# Patient Record
Sex: Male | Born: 1995 | Race: Black or African American | Hispanic: No | Marital: Single | Smoking: Current every day smoker
Health system: Southern US, Community
[De-identification: ages and names within clinical notes are randomized; demographics above are authoritative.]

## PROBLEM LIST (undated history)

## (undated) DIAGNOSIS — J45909 Unspecified asthma, uncomplicated: Secondary | ICD-10-CM

## (undated) DIAGNOSIS — M419 Scoliosis, unspecified: Secondary | ICD-10-CM

---

## 2018-03-27 ENCOUNTER — Emergency Department (HOSPITAL_COMMUNITY)
Admission: EM | Admit: 2018-03-27 | Discharge: 2018-03-27 | Disposition: A | Discharge status: Home / Self Care | Payer: Self-pay | Attending: Emergency Medicine | Admitting: Emergency Medicine

## 2018-03-27 ENCOUNTER — Emergency Department (HOSPITAL_COMMUNITY): Payer: Self-pay

## 2018-03-27 ENCOUNTER — Encounter (HOSPITAL_COMMUNITY): Payer: Self-pay | Admitting: *Deleted

## 2018-03-27 DIAGNOSIS — M62838 Other muscle spasm: Secondary | ICD-10-CM | POA: Insufficient documentation

## 2018-03-27 DIAGNOSIS — J45909 Unspecified asthma, uncomplicated: Secondary | ICD-10-CM | POA: Insufficient documentation

## 2018-03-27 DIAGNOSIS — F17210 Nicotine dependence, cigarettes, uncomplicated: Secondary | ICD-10-CM | POA: Insufficient documentation

## 2018-03-27 HISTORY — DX: Scoliosis, unspecified: M41.9

## 2018-03-27 HISTORY — DX: Unspecified asthma, uncomplicated: J45.909

## 2018-03-27 MED ORDER — CYCLOBENZAPRINE HCL 10 MG PO TABS: 10.0000 mg | ORAL_TABLET | Freq: Two times a day (BID) | ORAL | 0 refills | Status: AC | PRN

## 2018-03-27 MED ORDER — CYCLOBENZAPRINE HCL 10 MG PO TABS
10.0000 mg | ORAL_TABLET | Freq: Once | ORAL | Status: AC
  Administered 2018-03-27: 10 mg via ORAL
  Filled 2018-03-27: qty 1

## 2018-03-27 MED ORDER — NAPROXEN 500 MG PO TABS: 500.0000 mg | ORAL_TABLET | Freq: Two times a day (BID) | ORAL | 0 refills | Status: AC

## 2018-03-27 NOTE — Discharge Instructions (Addendum)
Follow up with the doctor that did your surgery or call Dr. Tonna BoehringerAluisso's office to schedule follow up. Do not drive while taking the muscle relaxer as it will make you sleepy.

## 2018-03-27 NOTE — ED Provider Notes (Signed)
[facility]-EMERGENCY DEPT Provider Note   CSN: [id number] Arrival date & time: 03/27/18  1358     History   Chief Complaint Chief Complaint  Patient presents with  . Neck Pain  . Headache  . Back Pain    HPI William Ferguson is a 22 y.o. male who presents to the ED with neck pain. Patient reports hx of scoliosis and a metal rod in his back. Patient reports that last night he was brushing his hair and turned his head and heard a pop in his neck followed by pain. Patient went to bed but today the pain has continued. Patient has not taken anything for pain. He went to work today and had to leave due to the pain.   HPI  Past Medical History:  Diagnosis Date  . Asthma   . Scoliosis     There are no active problems to display for this patient.   History reviewed. No pertinent surgical history.      Home Medications    Prior to Admission medications   Medication Sig Start Date End Date Taking? Authorizing Provider  cyclobenzaprine (FLEXERIL) 10 MG tablet Take 1 tablet (10 mg total) by mouth 2 (two) times daily as needed for muscle spasms. 03/27/18   Janne Napoleon, NP  naproxen (NAPROSYN) 500 MG tablet Take 1 tablet (500 mg total) by mouth 2 (two) times daily. 03/27/18   Janne Napoleon, NP    Family History No family history on file.  Social History Social History   Tobacco Use  . Smoking status: Current Every Day Smoker    Types: Cigarettes  . Smokeless tobacco: Never Used  . Tobacco comment: 2 cigarettes a day  Substance Use Topics  . Alcohol use: Yes  . Drug use: Not on file     Allergies   Patient has no known allergies.   Review of Systems Review of Systems  Constitutional: Negative for chills and fever.  HENT: Negative.   Eyes: Negative for visual disturbance.  Respiratory: Negative for shortness of breath and wheezing.   Cardiovascular: Negative for chest pain.  Gastrointestinal: Negative for abdominal pain, nausea and vomiting.   Genitourinary:       No loss of control of bladder or bowels.  Musculoskeletal: Positive for neck pain.  Skin: Negative for wound.  Neurological: Positive for headaches. Negative for syncope.  Psychiatric/Behavioral: Negative for confusion.     Physical Exam Updated Vital Signs BP 116/78 (BP Location: Right Arm)   Pulse 71   Temp 98.3 F (36.8 C) (Oral)   Resp 18   Ht 5\' 6"  (1.676 m)   Wt 62.6 kg (138 lb)   SpO2 100%   BMI 22.27 kg/m   Physical Exam  Constitutional: He is oriented to person, place, and time. He appears well-developed and well-nourished. No distress.  HENT:  Head: Normocephalic and atraumatic.  Eyes: Pupils are equal, round, and reactive to light. EOM are normal.  Neck: Neck supple. Spinous process tenderness and muscular tenderness present. No neck rigidity. Decreased range of motion present.  Cardiovascular: Normal rate, regular rhythm and normal heart sounds.  Pulmonary/Chest: Effort normal and breath sounds normal. He exhibits no tenderness.  Abdominal: Soft. Bowel sounds are normal. There is no tenderness.  Musculoskeletal:       Cervical back: He exhibits tenderness and spasm. He exhibits normal pulse. Decreased range of motion: due to pain.  Muscle spasm noted in neck muscles. There is a scar down the spine  where patient had surgery for scoliosis as a child. No tenderness to back.   Neurological: He is alert and oriented to person, place, and time. He has normal strength. Gait normal.  Reflex Scores:      Bicep reflexes are 2+ on the right side and 2+ on the left side.      Brachioradialis reflexes are 2+ on the right side and 2+ on the left side. Grips are equal, radial pulses 2+.  Skin: Skin is warm and dry.  Psychiatric: He has a normal mood and affect.  Nursing note and vitals reviewed.    ED Treatments / Results  Labs (all labs ordered are listed, but only abnormal results are displayed) Labs Reviewed - No data to  display  EKG Radiology Dg Cervical Spine Complete  Result Date: 03/27/2018 CLINICAL DATA:  Neck pain after twisting injury. EXAM: CERVICAL SPINE - COMPLETE 4+ VIEW COMPARISON:  None. FINDINGS: The lateral view images through mid C6 level. Prevertebral soft tissues are within normal limits. Apparent loss of intervertebral disc height at C3-4 is favored to be developmental. Maintenance of vertebral body height and alignment. Facets are well-aligned. Convex left cervicothoracic spinal curvature, with incompletely imaged thoracic spine hardware. Lateral masses and odontoid process partially obscured on open-mouth view. No gross abnormalities identified. IMPRESSION: Suboptimal evaluation of C1-2 and C7-T1. Apparent loss of intervertebral disc height at C3-4, favored to be developmental. No acute finding. Electronically Signed   By: Jeronimo GreavesKyle  Talbot M.D.   On: 03/27/2018 16:06    Procedures Procedures (including critical care time)  Medications Ordered in ED Medications  cyclobenzaprine (FLEXERIL) tablet 10 mg (10 mg Oral Given 03/27/18 1555)     Initial Impression / Assessment and Plan / ED Course  I have reviewed the triage vital signs and the nursing notes. 22 y.o. male here with neck pain after feeling a pop in his neck last night. Stable for d/c without fracture noted on x-ray. Patient to f/u with ortho. Will start muscle relaxer and NSAIDS. No neuro deficits.  Final Clinical Impressions(s) / ED Diagnoses   Final diagnoses:  Muscle spasms of neck    ED Discharge Orders        Ordered    cyclobenzaprine (FLEXERIL) 10 MG tablet  2 times daily PRN     03/27/18 1621    naproxen (NAPROSYN) 500 MG tablet  2 times daily     03/27/18 1621       Damian Leavelleese, LovingHope M, [place]NP 03/27/18 1631    Marily MemosMesner, Jason, MD 03/27/18 1637

## 2018-03-27 NOTE — ED Triage Notes (Signed)
Pt sts has hx of scoliosis and has metal rod in his back. He sts last night he cracked his neck too much and since then developed sharp pain in his neck, upper back, head. He sts it hurts to move his upper body even a little bit. Pt is tearful in triage.

## 2018-12-12 ENCOUNTER — Emergency Department (HOSPITAL_COMMUNITY)
Admission: EM | Admit: 2018-12-12 | Discharge: 2018-12-12 | Disposition: A | Discharge status: Home / Self Care | Payer: Self-pay | Attending: Emergency Medicine | Admitting: Emergency Medicine

## 2018-12-12 ENCOUNTER — Encounter (HOSPITAL_COMMUNITY): Payer: Self-pay

## 2018-12-12 ENCOUNTER — Other Ambulatory Visit: Payer: Self-pay

## 2018-12-12 DIAGNOSIS — M419 Scoliosis, unspecified: Secondary | ICD-10-CM | POA: Insufficient documentation

## 2018-12-12 DIAGNOSIS — Z0279 Encounter for issue of other medical certificate: Secondary | ICD-10-CM | POA: Insufficient documentation

## 2018-12-12 DIAGNOSIS — F17210 Nicotine dependence, cigarettes, uncomplicated: Secondary | ICD-10-CM | POA: Insufficient documentation

## 2018-12-12 DIAGNOSIS — Z09 Encounter for follow-up examination after completed treatment for conditions other than malignant neoplasm: Secondary | ICD-10-CM

## 2018-12-12 DIAGNOSIS — J45909 Unspecified asthma, uncomplicated: Secondary | ICD-10-CM | POA: Insufficient documentation

## 2018-12-12 NOTE — ED Triage Notes (Signed)
Pt reports falling at work and started having back pain. Pt has hx of scoliosis. Pt states that his work told him to go to ER to get checked out and get a work note. Pt reports not pain at his time.

## 2018-12-12 NOTE — Discharge Instructions (Signed)
You have been diagnosed today with Follow-up Examination.  At this time there does not appear to be the presence of an emergent medical condition, however there is always the potential for conditions to change. Please read and follow the below instructions.  Please return to the Emergency Department immediately for any new or worsening symptoms. Please be sure to follow up with your Primary Care Provider within one week regarding your visit today; please call their office to schedule an appointment even if you are feeling better for a follow-up visit.  Get help right away if: You develop new bowel or bladder control problems. You develop pain You have unusual weakness or numbness in your arms or legs. You develop nausea or vomiting. You develop abdominal pain. You feel faint. You have fever/chills Any new or concerning symptoms  Please read the additional information packets attached to your discharge summary.

## 2018-12-12 NOTE — ED Provider Notes (Signed)
[facility]-EMERGENCY DEPT Provider Note   CSN: [id number] Arrival date & time: 12/12/18  1056    History   Chief Complaint Chief Complaint  Patient presents with  . Back Pain    HPI William Ferguson is a 23 y.o. male with history of scoliosis presenting today for request of work note.  Patient reports that he was at work yesterday and was lifting a box when he lost his balance and fell sideways against a shelf.  Patient states that he was able to stay on his feet and did not fall to the ground.  Patient denies any and all pain relating to this incident and states that he felt fine immediately afterwards and did not have pain.  He denies head injury, neck pain, back pain, chest or abdominal pain, extremity pain, numbness/weakness or tingling.  He states that he attempted to continue working however his employer insisted that he leave work early to get evaluated and have work-note before returning.  Patient reports that his boss is concerned for him because she heard a loud noise when he fell sideways against the shelf and wanted him to be checked out because he does have a history of scoliosis with thoracic rods.  Patient reports that he feels fine and denies any and all pain or concerns.  He is requesting a note today so that he may return to work. Patient denies any concerns today.  Patient denies fever, chills, any pain, nausea/vomiting, headache/vision changes, neck pain, back pain, numbness/weakness or tingling, saddle area paresthesias, bowel/bladder incontinence, urinary retention, IV drug use, history of cancer.    HPI  Past Medical History:  Diagnosis Date  . Asthma   . Scoliosis     There are no active problems to display for this patient.   History reviewed. No pertinent surgical history.    Home Medications    Prior to Admission medications   Medication Sig Start Date End Date Taking? Authorizing Provider  cyclobenzaprine (FLEXERIL) 10 MG tablet  Take 1 tablet (10 mg total) by mouth 2 (two) times daily as needed for muscle spasms. 03/27/18   Janne Napoleon, NP  naproxen (NAPROSYN) 500 MG tablet Take 1 tablet (500 mg total) by mouth 2 (two) times daily. 03/27/18   Janne Napoleon, NP    Family History History reviewed. No pertinent family history.  Social History Social History   Tobacco Use  . Smoking status: Current Every Day Smoker    Types: Cigarettes  . Smokeless tobacco: Never Used  . Tobacco comment: 2 cigarettes a day  Substance Use Topics  . Alcohol use: Yes  . Drug use: Not on file     Allergies   Patient has no known allergies.   Review of Systems Review of Systems  Constitutional: Negative.  Negative for chills and fever.  Eyes: Negative.  Negative for visual disturbance.  Musculoskeletal: Negative.  Negative for arthralgias, back pain, myalgias and neck pain.  Skin: Negative.  Negative for wound.  Neurological: Negative.  Negative for syncope, weakness, numbness and headaches.       Denies bowel or bladder incontinence Denies saddle area paresthesias Denies urinary retention   Physical Exam Updated Vital Signs BP (!) 132/93   Pulse 75   Temp 98.6 F (37 C) (Oral)   Resp 16   SpO2 99%   Physical Exam Constitutional:      General: He is not in acute distress.    Appearance: Normal appearance. He is well-developed. He  is not ill-appearing or diaphoretic.  HENT:     Head: Normocephalic and atraumatic.     Jaw: There is normal jaw occlusion. No trismus.     Right Ear: Tympanic membrane, ear canal and external ear normal. No hemotympanum.     Left Ear: Tympanic membrane, ear canal and external ear normal. No hemotympanum.     Nose: Nose normal.     Mouth/Throat:     Lips: Pink.     Mouth: Mucous membranes are moist.     Pharynx: Oropharynx is clear. Uvula midline.  Eyes:     General: Vision grossly intact. Gaze aligned appropriately.     Extraocular Movements: Extraocular movements intact.      Conjunctiva/sclera: Conjunctivae normal.     Pupils: Pupils are equal, round, and reactive to light.  Neck:     Musculoskeletal: Full passive range of motion without pain, normal range of motion and neck supple. No spinous process tenderness or muscular tenderness.     Trachea: Trachea and phonation normal. No tracheal deviation.  Cardiovascular:     Rate and Rhythm: Normal rate and regular rhythm.     Pulses: Normal pulses.          Dorsalis pedis pulses are 2+ on the right side and 2+ on the left side.       Posterior tibial pulses are 2+ on the right side and 2+ on the left side.     Heart sounds: Normal heart sounds.  Pulmonary:     Effort: Pulmonary effort is normal. No respiratory distress.     Breath sounds: Normal breath sounds and air entry.  Chest:     Chest wall: No deformity, tenderness or crepitus.     Comments: No sign of injury of the chest Abdominal:     General: There is no distension.     Palpations: Abdomen is soft.     Tenderness: There is no abdominal tenderness. There is no guarding or rebound.     Comments: No sign of injury of the abdomen  Musculoskeletal: Normal range of motion.     Comments: Patient with scoliosis.  No midline cervical/thoracic/lumbar spinal tenderness to palpation.  No paraspinal muscular tenderness to palpation.  No crepitus or step-off noted.  Patient does have well-healed surgical scar in the mid thoracic region from previous rod placement and he has no tenderness of this area.  There is no signs of injury to the back or neck.  Hips stable to compression bilaterally.  Patient ambulatory without difficulty or assistance and is able to bring both knees to his chest without pain or difficulty.  All major joints brought through range of motion without pain, crepitus or deformity.  Feet:     Right foot:     Protective Sensation: 3 sites tested. 3 sites sensed.     Left foot:     Protective Sensation: 3 sites tested. 3 sites sensed.  Skin:     General: Skin is warm and dry.  Neurological:     Mental Status: He is alert.     GCS: GCS eye subscore is 4. GCS verbal subscore is 5. GCS motor subscore is 6.     Comments: Mental Status: Alert, oriented, thought content appropriate, able to give a coherent history. Speech fluent without evidence of aphasia. Able to follow 2 step commands without difficulty. Cranial Nerves: II: Peripheral visual fields grossly normal, pupils equal, round, reactive to light III,IV, VI: ptosis not present, extra-ocular motions intact bilaterally V,VII:  smile symmetric, eyebrows raise symmetric, facial light touch sensation equal VIII: hearing grossly normal to voice X: uvula elevates symmetrically XI: bilateral shoulder shrug symmetric and strong XII: midline tongue extension without fassiculations Motor: Normal tone. 5/5 strength in upper and lower extremities bilaterally including strong and equal grip strength and dorsiflexion/plantar flexion Sensory: Sensation intact to light touch in all extremities.Negative Romberg.  Deep Tendon Reflexes: 2+ and symmetric in the biceps and patella Cerebellar: normal finger-to-nose with bilateral upper extremities. Normal heel-to -shin balance bilaterally of the lower extremity. No pronator drift.  Gait: normal gait and balance CV: distal pulses palpable throughout  Psychiatric:        Behavior: Behavior normal.    ED Treatments / Results  Labs (all labs ordered are listed, but only abnormal results are displayed) Labs Reviewed - No data to display  EKG None  Radiology No results found.  Procedures Procedures (including critical care time)  Medications Ordered in ED Medications - No data to display   Initial Impression / Assessment and Plan / ED Course  I have reviewed the triage vital signs and the nursing notes.  Pertinent labs & imaging results that were available during my care of the patient were reviewed by me and considered in my  medical decision making (see chart for details).    William Ferguson is a 23 y.o. male presenting for note to return back to work today after having to leave work due to fall yesterday.  Patient denies true fall today, he states he lost his balance while lifting a box and fell sideways against a shelf, he denies falling to the ground.  He denies head injury, loss of consciousness or blood thinner use.  Patient states that he felt well after the incident and had no pain.  Patient reports that his boss was concerned because of the loud noise that occurred and patient's history of scoliosis with rod placement that he needed to be checked out at the hospital and cannot return to work until he did so.  Patient reports that he went about his normal daily activities yesterday after leaving work without pain or difficulty.  On my examination patient reports that he never had pain related to the incident yesterday and is not having any pain now.    Patient does have history of thoracic rod placement due to his scoliosis however he denies concern or pain related this.  He denies fever, IV drug use, night sweats, weight loss, cancer, saddle area paresthesias, urinary retention, bowel/bladder incontinence.  He denies numbness/weakness or tingling.  Physical examination reveals no sign of injury of the head, neck/back, abdomen/chest or extremities.  Neurologic exam without deficit today.  Patient is ambulatory around room and is requesting work note and discharge.  Based on history provided by patient and reassuring examination today I do not feel that imaging is indicated at this time.  Patient agrees and he also does not want imaging performed and is requesting discharge.  Patient states that he is without pain and does not wish to have pain medicine either.  At discharge patient is afebrile, not tachycardic, not hypotensive, he is well-appearing and in no acute distress.  At this time there does not appear to be  any evidence of an acute emergency medical condition and the patient appears stable for discharge with appropriate outpatient follow up. Diagnosis was discussed with patient who verbalizes understanding of care plan and is agreeable to discharge. I have discussed return precautions with patient who verbalizes  understanding of return precautions. Patient strongly encouraged to follow-up with their PCP. All questions answered. Patient has been discharged in good condition.   Note: Portions of this report may have been transcribed using voice recognition software. Every effort was made to ensure accuracy; however, inadvertent computerized transcription errors may still be present.  Final Clinical Impressions(s) / ED Diagnoses   Final diagnoses:  Follow-up exam    ED Discharge Orders    None       Elizabeth Palau 12/12/18 1246    Bethann Berkshire, MD 12/12/18 [phone]

## 2019-06-04 ENCOUNTER — Other Ambulatory Visit: Payer: Self-pay

## 2019-06-04 ENCOUNTER — Encounter (HOSPITAL_COMMUNITY): Payer: Self-pay | Admitting: Emergency Medicine

## 2019-06-04 ENCOUNTER — Emergency Department (HOSPITAL_COMMUNITY)
Admission: EM | Admit: 2019-06-04 | Discharge: 2019-06-04 | Disposition: A | Discharge status: Home / Self Care | Payer: Self-pay | Attending: Emergency Medicine | Admitting: Emergency Medicine

## 2019-06-04 DIAGNOSIS — R1111 Vomiting without nausea: Secondary | ICD-10-CM | POA: Insufficient documentation

## 2019-06-04 DIAGNOSIS — J45909 Unspecified asthma, uncomplicated: Secondary | ICD-10-CM | POA: Insufficient documentation

## 2019-06-04 DIAGNOSIS — Z7689 Persons encountering health services in other specified circumstances: Secondary | ICD-10-CM

## 2019-06-04 DIAGNOSIS — F17210 Nicotine dependence, cigarettes, uncomplicated: Secondary | ICD-10-CM | POA: Insufficient documentation

## 2019-06-04 DIAGNOSIS — Z0279 Encounter for issue of other medical certificate: Secondary | ICD-10-CM | POA: Insufficient documentation

## 2019-06-04 NOTE — Discharge Instructions (Signed)
Please follow up with your primary care provider within 5-7 days for re-evaluation of your symptoms. If you do not have a primary care provider, information for a healthcare clinic has been provided for you to make arrangements for follow up care. Please return to the emergency department for any new or worsening symptoms. ° °

## 2019-06-04 NOTE — ED Triage Notes (Signed)
Pt reports that he had shrimp gumbo yesterday that was pot luck and got sick and had to leave work. Pt reports that he needs a note to return to work. Denies any problems or symptoms today.

## 2019-06-04 NOTE — ED Provider Notes (Signed)
[facility] DEPT Provider Note   CSN: [id number] Arrival date & time: 06/04/19  1504    History   Chief Complaint Chief Complaint  Patient presents with  . work note    HPI William Ferguson is a 23 y.o. male.     HPI   Patient is a 23 year old male with a history of asthma presenting the emergency department today requesting a return to work note.  States he ate shrimp gumbo yesterday at a potluck and had an episode of vomiting.  He was told to leave work at that time.  He has had no further episodes of vomiting.  Denies abdominal pain, diarrhea, urinary complaints or fevers.  He is requesting a note so that he can return to work.  Past Medical History:  Diagnosis Date  . Asthma   . Scoliosis     There are no active problems to display for this patient.   History reviewed. No pertinent surgical history.      Home Medications    Prior to Admission medications   Medication Sig Start Date End Date Taking? Authorizing Provider  cyclobenzaprine (FLEXERIL) 10 MG tablet Take 1 tablet (10 mg total) by mouth 2 (two) times daily as needed for muscle spasms. 03/27/18   Ashley Murrain, NP  naproxen (NAPROSYN) 500 MG tablet Take 1 tablet (500 mg total) by mouth 2 (two) times daily. 03/27/18   Ashley Murrain, NP    Family History No family history on file.  Social History Social History   Tobacco Use  . Smoking status: Current Every Day Smoker    Types: Cigarettes  . Smokeless tobacco: Never Used  . Tobacco comment: 2 cigarettes a day  Substance Use Topics  . Alcohol use: Yes  . Drug use: Not on file     Allergies   Patient has no known allergies.   Review of Systems Review of Systems  Constitutional: Negative for fever.  Respiratory: Negative for shortness of breath.   Cardiovascular: Negative for chest pain.  Gastrointestinal: Positive for nausea and vomiting. Negative for abdominal pain, constipation and diarrhea.  Genitourinary:  Negative for dysuria and urgency.  Musculoskeletal: Negative for back pain.  Neurological: Negative for headaches.     Physical Exam Updated Vital Signs BP 122/82 (BP Location: Left Arm)   Pulse 80   Temp 99.1 F (37.3 C) (Oral)   Resp 14   SpO2 98%   Physical Exam Constitutional:      General: He is not in acute distress.    Appearance: He is well-developed. He is not ill-appearing or toxic-appearing.  Eyes:     Conjunctiva/sclera: Conjunctivae normal.  Cardiovascular:     Rate and Rhythm: Normal rate and regular rhythm.     Heart sounds: Normal heart sounds.  Pulmonary:     Effort: Pulmonary effort is normal.     Breath sounds: Normal breath sounds. No wheezing.  Abdominal:     General: Bowel sounds are normal.     Palpations: Abdomen is soft.     Tenderness: There is no guarding or rebound.  Skin:    General: Skin is warm and dry.  Neurological:     Mental Status: He is alert and oriented to person, place, and time.      ED Treatments / Results  Labs (all labs ordered are listed, but only abnormal results are displayed) Labs Reviewed - No data to display  EKG None  Radiology No results found.  Procedures Procedures (  including critical care time)  Medications Ordered in ED Medications - No data to display   Initial Impression / Assessment and Plan / ED Course  I have reviewed the triage vital signs and the nursing notes.  Pertinent labs & imaging results that were available during my care of the patient were reviewed by me and considered in my medical decision making (see chart for details).     Final Clinical Impressions(s) / ED Diagnoses   Final diagnoses:  Return to work evaluation  Non-intractable vomiting without nausea, unspecified vomiting type   Patient is a 23 year old male with a history of asthma presenting the emergency department today requesting a return to work note.  States he ate shrimp gumbo yesterday at a potluck and had an  episode of vomiting.  He was told to leave work at that time.  He has had no further episodes of vomiting.  Denies abdominal pain, diarrhea, urinary complaints or fevers.  He is requesting a note so that he can return to work.  His vital signs are reassuring.  His exam is benign.  I offered work-up with labs and patient declines.  Advised on hydration and return precautions.  He voices understanding and is in agreement.  All questions answered.  Patient stable discharge.  ED Discharge Orders    None       Rayne DuCouture, Nesbit Michon S, PA-C 06/04/19 1811    Linwood DibblesKnapp, Jon, MD 06/04/19 2322

## 2019-06-14 ENCOUNTER — Other Ambulatory Visit: Payer: Self-pay

## 2019-06-14 DIAGNOSIS — Z20822 Contact with and (suspected) exposure to covid-19: Secondary | ICD-10-CM

## 2019-06-16 LAB — NOVEL CORONAVIRUS, NAA: SARS-CoV-2, NAA: NOT DETECTED

## 2020-02-17 IMAGING — CR DG CERVICAL SPINE COMPLETE 4+V
6 series · 6 of 6 positions shown · non-contrast
Comparison: None.

CLINICAL DATA: Neck pain after twisting injury.

EXAM:
CERVICAL SPINE - COMPLETE 4+ VIEW

[w cervical spine lat]
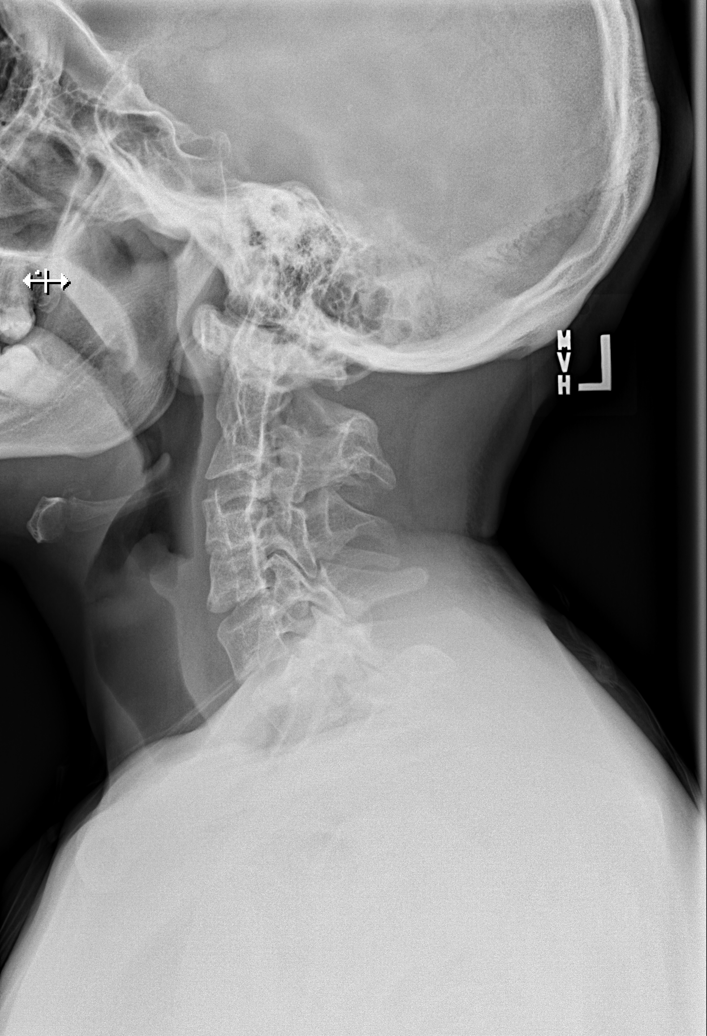

[w cervical spine ap_obl (1 of 2)]
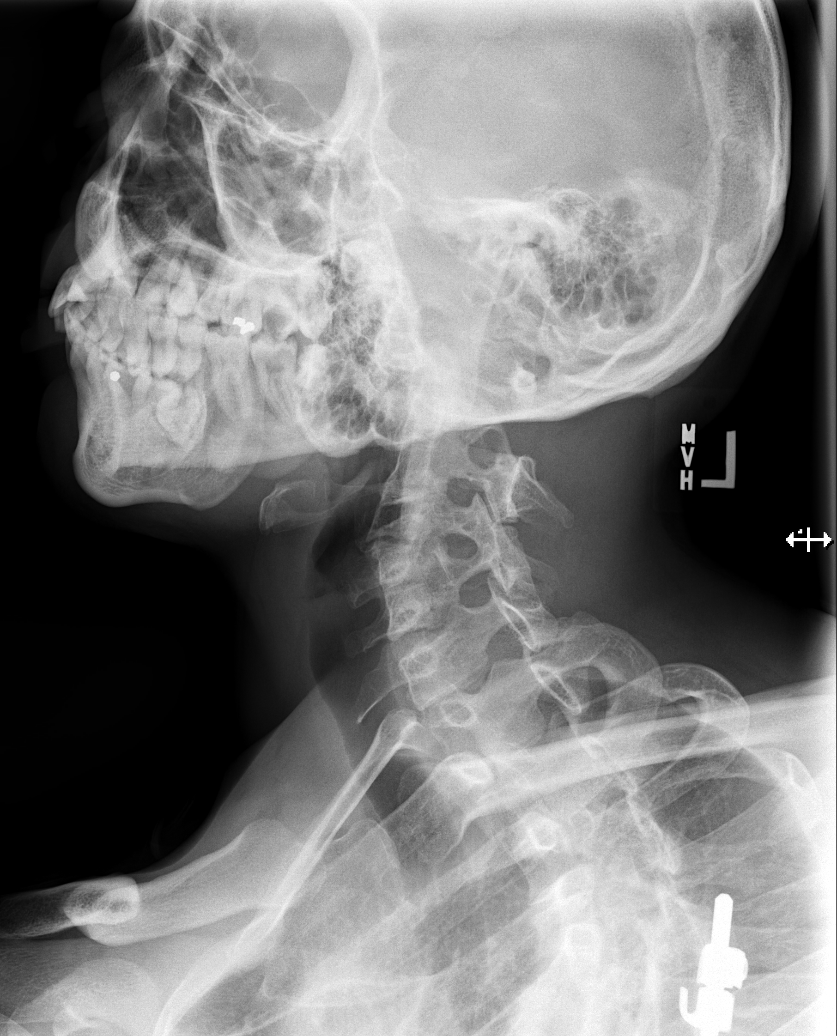

[w cervical spine ap_obl (2 of 2)]
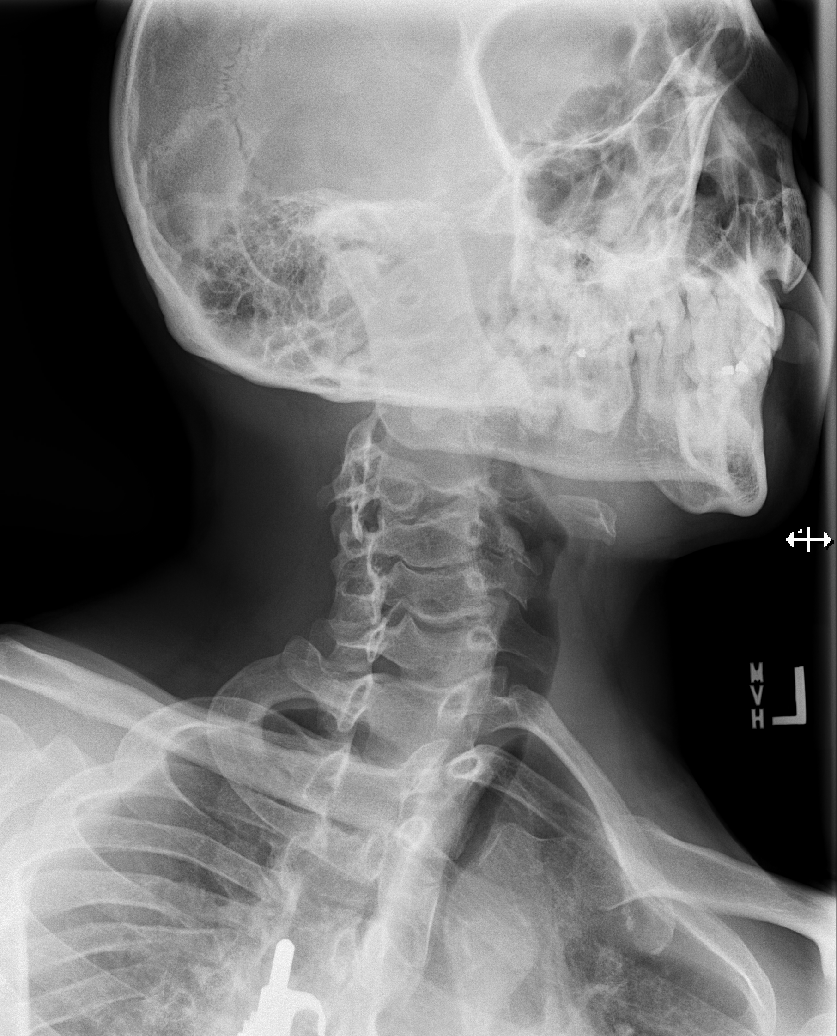

[w cervical spine ap]
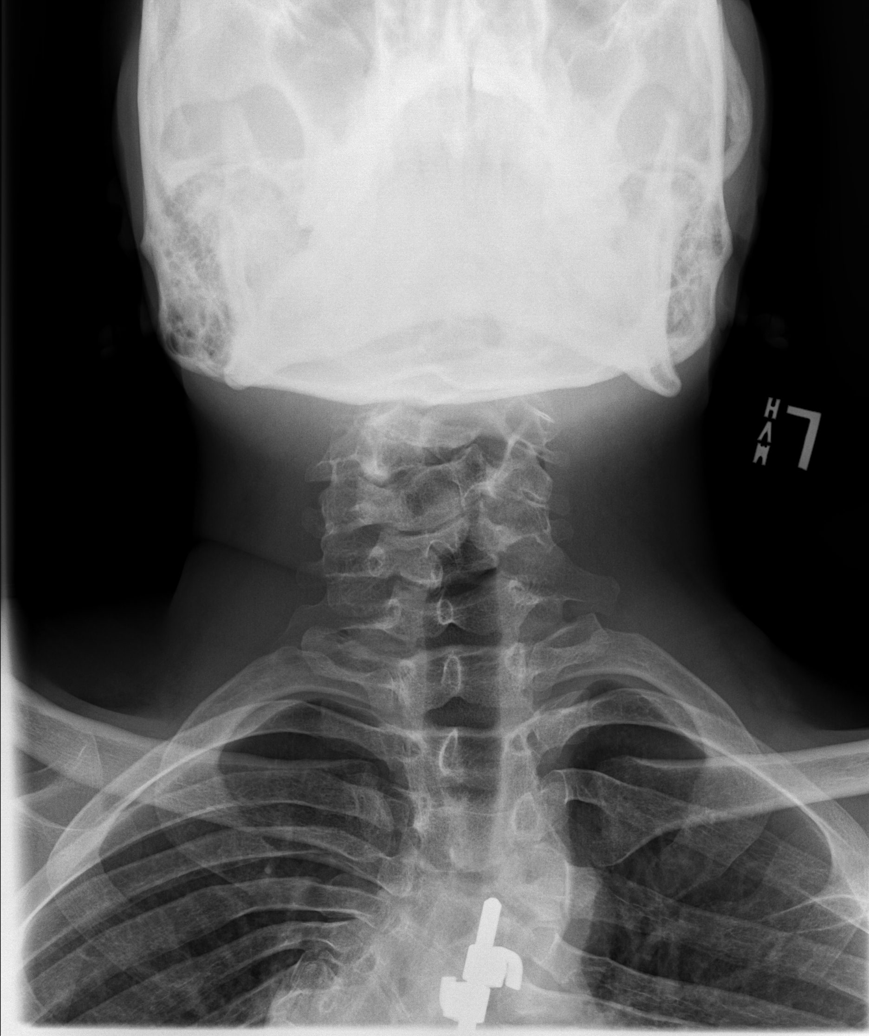

[w cervical spine odontoid (1 of 2)]
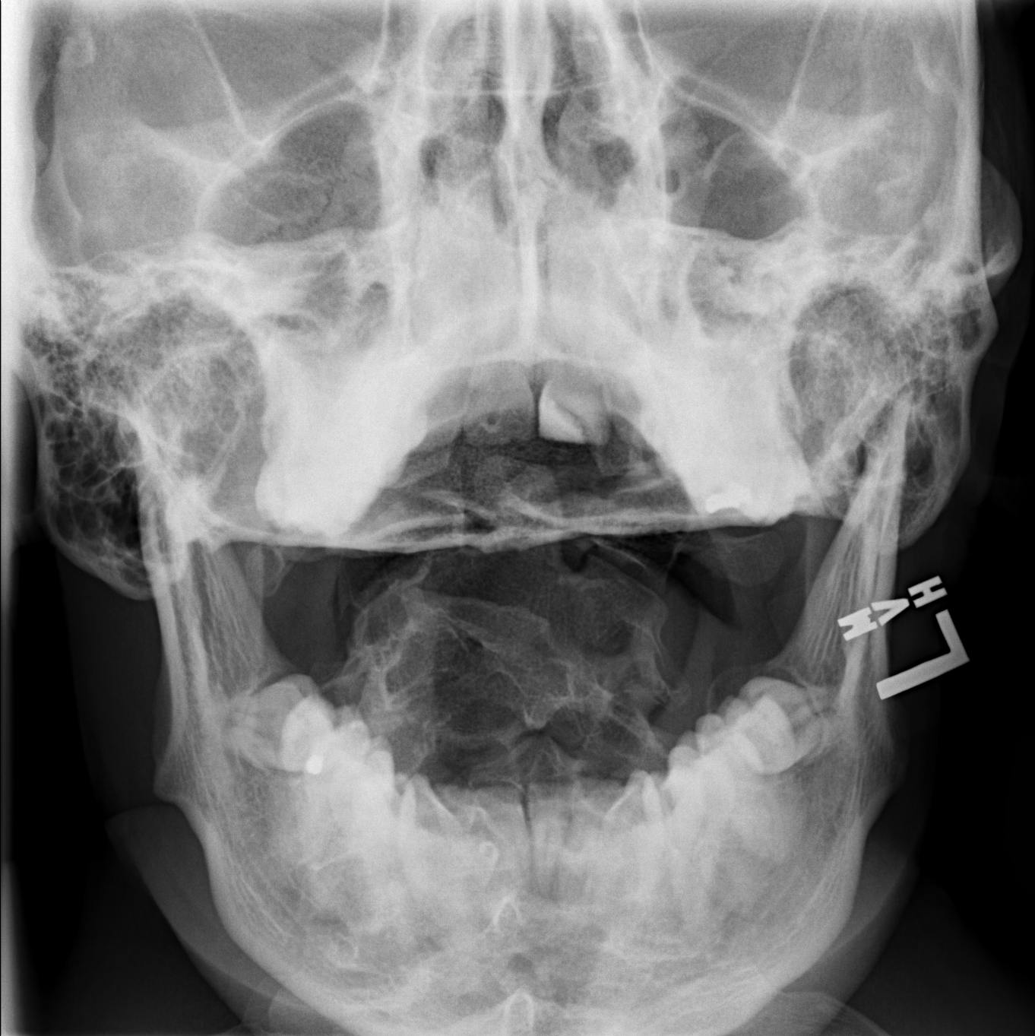

[w cervical spine odontoid (2 of 2)]
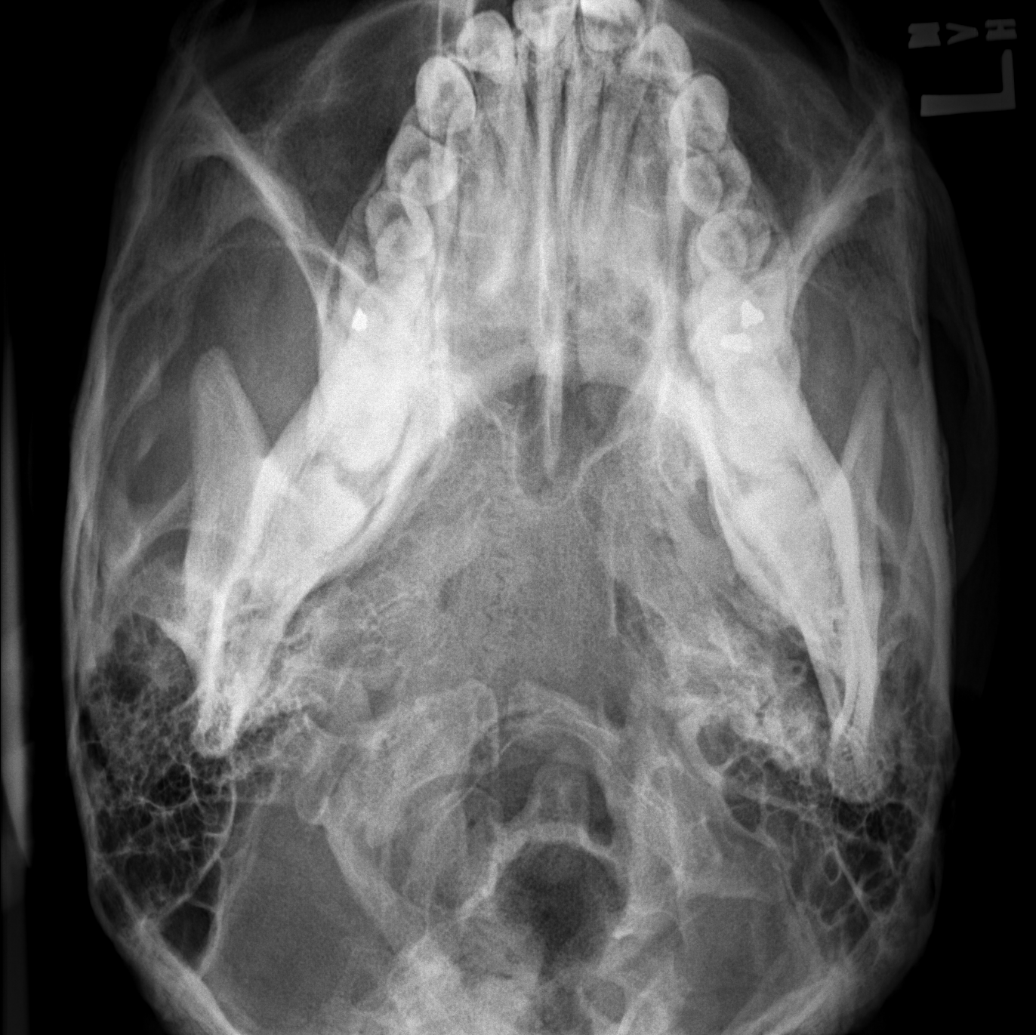

[6 of 6 positions shown; findings below may reference images not displayed]

FINDINGS: The lateral view images through mid C6 level. Prevertebral soft
tissues are within normal limits. Apparent loss of intervertebral
disc height at C3-4 is favored to be developmental. Maintenance of
vertebral body height and alignment. Facets are well-aligned. Convex
left cervicothoracic spinal curvature, with incompletely imaged
thoracic spine hardware. Lateral masses and odontoid process
partially obscured on open-mouth view. No gross abnormalities
identified.
IMPRESSION: Suboptimal evaluation of C1-2 and C7-T1.

Apparent loss of intervertebral disc height at C3-4, favored to be
developmental.

No acute finding.
